# Patient Record
Sex: Male | Born: 1975 | Hispanic: No | Marital: Single | Smoking: Never smoker
Health system: Southern US, Community
[De-identification: ages and names within clinical notes are randomized; demographics above are authoritative.]

---

## 2002-04-24 ENCOUNTER — Encounter: Payer: Self-pay | Admitting: General Practice

## 2002-04-24 ENCOUNTER — Encounter: Admission: RE | Admit: 2002-04-24 | Discharge: 2002-04-24 | Payer: Self-pay | Admitting: General Practice

## 2002-05-24 ENCOUNTER — Emergency Department (HOSPITAL_COMMUNITY): Admission: EM | Admit: 2002-05-24 | Discharge: 2002-05-24 | Payer: Self-pay | Admitting: Emergency Medicine

## 2004-06-14 HISTORY — PX: BACK SURGERY: SHX140

## 2006-04-26 ENCOUNTER — Ambulatory Visit: Payer: Self-pay | Admitting: Gastroenterology

## 2006-04-26 LAB — CONVERTED CEMR LAB
Iron: 62 ug/dL (ref 42–165)
Sed Rate: 10 mm/hr (ref 0–20)
Vitamin B-12: 697 pg/mL (ref 211–911)

## 2006-04-29 ENCOUNTER — Ambulatory Visit: Payer: Self-pay | Admitting: Gastroenterology

## 2006-05-02 ENCOUNTER — Encounter (INDEPENDENT_AMBULATORY_CARE_PROVIDER_SITE_OTHER): Payer: Self-pay | Admitting: *Deleted

## 2006-05-02 ENCOUNTER — Ambulatory Visit: Payer: Self-pay | Admitting: Gastroenterology

## 2006-05-09 ENCOUNTER — Ambulatory Visit (HOSPITAL_COMMUNITY): Admission: RE | Admit: 2006-05-09 | Discharge: 2006-05-09 | Payer: Self-pay | Admitting: Gastroenterology

## 2006-10-06 ENCOUNTER — Encounter: Admission: RE | Admit: 2006-10-06 | Discharge: 2006-10-06 | Payer: Self-pay | Admitting: Specialist

## 2006-12-01 ENCOUNTER — Inpatient Hospital Stay (HOSPITAL_COMMUNITY): Admission: RE | Admit: 2006-12-01 | Discharge: 2006-12-02 | Payer: Self-pay | Admitting: Specialist

## 2008-10-30 ENCOUNTER — Emergency Department (HOSPITAL_COMMUNITY): Admission: EM | Admit: 2008-10-30 | Discharge: 2008-10-30 | Payer: Self-pay | Admitting: Emergency Medicine

## 2008-11-18 ENCOUNTER — Emergency Department (HOSPITAL_COMMUNITY): Admission: EM | Admit: 2008-11-18 | Discharge: 2008-11-19 | Payer: Self-pay | Admitting: Emergency Medicine

## 2009-05-26 ENCOUNTER — Emergency Department (HOSPITAL_COMMUNITY): Admission: EM | Admit: 2009-05-26 | Discharge: 2009-05-26 | Payer: Self-pay | Admitting: Emergency Medicine

## 2009-10-16 ENCOUNTER — Emergency Department (HOSPITAL_COMMUNITY): Admission: EM | Admit: 2009-10-16 | Discharge: 2009-10-16 | Payer: Self-pay | Admitting: Emergency Medicine

## 2009-11-03 ENCOUNTER — Emergency Department (HOSPITAL_COMMUNITY): Admission: EM | Admit: 2009-11-03 | Discharge: 2009-11-03 | Payer: Self-pay | Admitting: Emergency Medicine

## 2009-12-12 ENCOUNTER — Emergency Department (HOSPITAL_COMMUNITY): Admission: EM | Admit: 2009-12-12 | Discharge: 2009-12-13 | Payer: Self-pay | Admitting: Emergency Medicine

## 2010-02-13 ENCOUNTER — Encounter
Admission: RE | Admit: 2010-02-13 | Discharge: 2010-03-17 | Payer: Self-pay | Admitting: Physical Medicine & Rehabilitation

## 2010-02-20 ENCOUNTER — Ambulatory Visit: Payer: Self-pay | Admitting: Physical Medicine & Rehabilitation

## 2010-03-02 ENCOUNTER — Ambulatory Visit (HOSPITAL_COMMUNITY)
Admission: RE | Admit: 2010-03-02 | Discharge: 2010-03-02 | Payer: Self-pay | Admitting: Physical Medicine & Rehabilitation

## 2010-03-12 ENCOUNTER — Encounter
Admission: RE | Admit: 2010-03-12 | Discharge: 2010-03-19 | Payer: Self-pay | Source: Home / Self Care | Attending: Physical Medicine & Rehabilitation | Admitting: Physical Medicine & Rehabilitation

## 2010-03-17 ENCOUNTER — Encounter
Admission: RE | Admit: 2010-03-17 | Discharge: 2010-03-24 | Payer: Self-pay | Source: Home / Self Care | Attending: Physical Medicine & Rehabilitation | Admitting: Physical Medicine & Rehabilitation

## 2010-03-19 ENCOUNTER — Ambulatory Visit: Payer: Self-pay | Admitting: Physical Medicine & Rehabilitation

## 2010-03-24 ENCOUNTER — Ambulatory Visit: Payer: Self-pay | Admitting: Physical Medicine & Rehabilitation

## 2010-05-26 ENCOUNTER — Encounter
Admission: RE | Admit: 2010-05-26 | Discharge: 2010-07-14 | Payer: Self-pay | Source: Home / Self Care | Attending: Physical Medicine & Rehabilitation | Admitting: Physical Medicine & Rehabilitation

## 2010-05-26 ENCOUNTER — Ambulatory Visit: Payer: Self-pay | Admitting: Physical Medicine & Rehabilitation

## 2010-07-01 ENCOUNTER — Encounter
Admission: RE | Admit: 2010-07-01 | Discharge: 2010-07-14 | Payer: Self-pay | Source: Home / Self Care | Attending: Physical Medicine & Rehabilitation | Admitting: Physical Medicine & Rehabilitation

## 2010-07-05 ENCOUNTER — Encounter: Payer: Self-pay | Admitting: Gastroenterology

## 2010-07-13 ENCOUNTER — Ambulatory Visit
Admission: RE | Admit: 2010-07-13 | Discharge: 2010-07-13 | Payer: Self-pay | Source: Home / Self Care | Attending: Physical Medicine & Rehabilitation | Admitting: Physical Medicine & Rehabilitation

## 2010-08-11 ENCOUNTER — Ambulatory Visit: Payer: Self-pay | Admitting: Physical Medicine & Rehabilitation

## 2010-08-11 ENCOUNTER — Encounter: Payer: Medicaid Other | Attending: Physical Medicine & Rehabilitation

## 2010-08-30 LAB — DIFFERENTIAL
Basophils Absolute: 0 10*3/uL (ref 0.0–0.1)
Basophils Relative: 0 % (ref 0–1)
Eosinophils Absolute: 0.1 10*3/uL (ref 0.0–0.7)
Eosinophils Relative: 1 % (ref 0–5)
Monocytes Relative: 6 % (ref 3–12)
Neutrophils Relative %: 80 % — ABNORMAL HIGH (ref 43–77)

## 2010-08-30 LAB — CBC
HCT: 42.6 % (ref 39.0–52.0)
Hemoglobin: 14.6 g/dL (ref 13.0–17.0)
MCHC: 34.1 g/dL (ref 30.0–36.0)

## 2010-08-30 LAB — URINALYSIS, ROUTINE W REFLEX MICROSCOPIC
Bilirubin Urine: NEGATIVE
Ketones, ur: 40 mg/dL — AB
Specific Gravity, Urine: 1.024 (ref 1.005–1.030)
pH: 6.5 (ref 5.0–8.0)

## 2010-08-30 LAB — COMPREHENSIVE METABOLIC PANEL
ALT: 24 U/L (ref 0–53)
AST: 25 U/L (ref 0–37)
Albumin: 3.5 g/dL (ref 3.5–5.2)
Calcium: 8.5 mg/dL (ref 8.4–10.5)
Chloride: 103 mEq/L (ref 96–112)
Creatinine, Ser: 1.01 mg/dL (ref 0.4–1.5)
GFR calc non Af Amer: >60 mL/min (ref >60)
Potassium: 3.3 mEq/L — ABNORMAL LOW (ref 3.5–5.1)
Total Protein: 7.6 g/dL (ref 6.0–8.3)

## 2010-09-01 LAB — CBC
HCT: 40.5 % (ref 39.0–52.0)
MCHC: 34.7 g/dL (ref 30.0–36.0)
MCV: 90.5 fL (ref 78.0–100.0)
Platelets: 295 10*3/uL (ref 150–400)
RBC: 4.47 MIL/uL (ref 4.22–5.81)
RDW: 12.4 % (ref 11.5–15.5)
WBC: 5.4 10*3/uL (ref 4.0–10.5)

## 2010-09-01 LAB — DIFFERENTIAL
Basophils Absolute: 0 10*3/uL (ref 0.0–0.1)
Basophils Relative: 0 % (ref 0–1)
Eosinophils Absolute: 0.5 10*3/uL (ref 0.0–0.7)
Eosinophils Relative: 10 % — ABNORMAL HIGH (ref 0–5)
Lymphocytes Relative: 42 % (ref 12–46)
Neutrophils Relative %: 41 % — ABNORMAL LOW (ref 43–77)

## 2010-09-01 LAB — URINALYSIS, ROUTINE W REFLEX MICROSCOPIC
Bilirubin Urine: NEGATIVE
Protein, ur: NEGATIVE mg/dL
Urobilinogen, UA: 1 mg/dL (ref 0.0–1.0)

## 2010-09-01 LAB — COMPREHENSIVE METABOLIC PANEL
ALT: 26 U/L (ref 0–53)
AST: 24 U/L (ref 0–37)
Albumin: 3.6 g/dL (ref 3.5–5.2)
Alkaline Phosphatase: 63 U/L (ref 39–117)
Calcium: 9 mg/dL (ref 8.4–10.5)
Chloride: 105 mEq/L (ref 96–112)
Creatinine, Ser: 0.9 mg/dL (ref 0.4–1.5)
GFR calc Af Amer: >60 mL/min (ref >60)

## 2010-09-01 LAB — URINE MICROSCOPIC-ADD ON

## 2010-09-01 LAB — LIPASE, BLOOD: Lipase: 37 U/L (ref 11–59)

## 2010-09-21 LAB — POCT I-STAT, CHEM 8
BUN: 11 mg/dL (ref 6–23)
Calcium, Ion: 1.16 mmol/L (ref 1.12–1.32)
Chloride: 103 mEq/L (ref 96–112)
Creatinine, Ser: 1.1 mg/dL (ref 0.4–1.5)
Glucose, Bld: 92 mg/dL (ref 70–99)
Potassium: 3.9 mEq/L (ref 3.5–5.1)

## 2010-09-21 LAB — URINALYSIS, ROUTINE W REFLEX MICROSCOPIC
Glucose, UA: NEGATIVE mg/dL
Leukocytes, UA: NEGATIVE
Nitrite: NEGATIVE
Protein, ur: 30 mg/dL — AB
pH: 6 (ref 5.0–8.0)

## 2010-09-21 LAB — RAPID STREP SCREEN (MED CTR MEBANE ONLY): Streptococcus, Group A Screen (Direct): POSITIVE — AB

## 2010-09-21 LAB — CBC
HCT: 49 % (ref 39.0–52.0)
MCHC: 33.4 g/dL (ref 30.0–36.0)
Platelets: 199 10*3/uL (ref 150–400)
RDW: 12.8 % (ref 11.5–15.5)

## 2010-09-21 LAB — URINE MICROSCOPIC-ADD ON

## 2010-09-21 LAB — DIFFERENTIAL
Basophils Relative: 0 % (ref 0–1)
Lymphocytes Relative: 16 % (ref 12–46)
Lymphs Abs: 1.4 10*3/uL (ref 0.7–4.0)
Monocytes Absolute: 0.7 10*3/uL (ref 0.1–1.0)
Monocytes Relative: 8 % (ref 3–12)
Neutro Abs: 6.3 10*3/uL (ref 1.7–7.7)
Neutrophils Relative %: 73 % (ref 43–77)

## 2010-09-21 LAB — POCT CARDIAC MARKERS
CKMB, poc: <1 ng/mL — ABNORMAL LOW (ref 1.0–8.0)
Troponin i, poc: <0.05 ng/mL (ref 0.00–0.09)

## 2010-09-21 LAB — COMPREHENSIVE METABOLIC PANEL
Albumin: 3.7 g/dL (ref 3.5–5.2)
Alkaline Phosphatase: 102 U/L (ref 39–117)
BUN: 8 mg/dL (ref 6–23)
Calcium: 9.8 mg/dL (ref 8.4–10.5)
Potassium: 3.7 mEq/L (ref 3.5–5.1)
Total Protein: 9.2 g/dL — ABNORMAL HIGH (ref 6.0–8.3)

## 2010-09-22 LAB — URINALYSIS, ROUTINE W REFLEX MICROSCOPIC
Hgb urine dipstick: NEGATIVE
Nitrite: NEGATIVE
Protein, ur: NEGATIVE mg/dL
Specific Gravity, Urine: 1.02 (ref 1.005–1.030)
Urobilinogen, UA: 0.2 mg/dL (ref 0.0–1.0)

## 2010-09-22 LAB — DIFFERENTIAL
Basophils Absolute: 0 10*3/uL (ref 0.0–0.1)
Basophils Relative: 1 % (ref 0–1)
Lymphocytes Relative: 23 % (ref 12–46)
Neutro Abs: 3.6 10*3/uL (ref 1.7–7.7)
Neutrophils Relative %: 61 % (ref 43–77)

## 2010-09-22 LAB — POCT I-STAT, CHEM 8
Creatinine, Ser: 1.1 mg/dL (ref 0.4–1.5)
Glucose, Bld: 99 mg/dL (ref 70–99)
HCT: 47 % (ref 39.0–52.0)
Hemoglobin: 16 g/dL (ref 13.0–17.0)
Potassium: 3.9 mEq/L (ref 3.5–5.1)
Sodium: 136 mEq/L (ref 135–145)
TCO2: 27 mmol/L (ref 0–100)

## 2010-09-22 LAB — CBC
Hemoglobin: 14.3 g/dL (ref 13.0–17.0)
MCHC: 32.2 g/dL (ref 30.0–36.0)
RBC: 4.94 MIL/uL (ref 4.22–5.81)
RDW: 13.1 % (ref 11.5–15.5)

## 2010-10-12 ENCOUNTER — Encounter: Payer: Medicaid Other | Attending: Physical Medicine & Rehabilitation | Admitting: Physical Medicine & Rehabilitation

## 2010-10-12 DIAGNOSIS — M5137 Other intervertebral disc degeneration, lumbosacral region: Secondary | ICD-10-CM

## 2010-10-12 DIAGNOSIS — M545 Low back pain, unspecified: Secondary | ICD-10-CM | POA: Insufficient documentation

## 2010-10-12 DIAGNOSIS — IMO0002 Reserved for concepts with insufficient information to code with codable children: Secondary | ICD-10-CM | POA: Insufficient documentation

## 2010-10-12 DIAGNOSIS — M79609 Pain in unspecified limb: Secondary | ICD-10-CM | POA: Insufficient documentation

## 2010-10-12 DIAGNOSIS — M961 Postlaminectomy syndrome, not elsewhere classified: Secondary | ICD-10-CM | POA: Insufficient documentation

## 2010-10-12 NOTE — Assessment & Plan Note (Signed)
Roy Miller is back regarding his back and leg pain.  He missed his last appointment as he stated he forgot and he was not called with the reminder.  He had about 4-5 days of significant relief with the last epidural steroid injection, but pain has come back again.  He rates his pain 8-9/10.  Pain is sharp.  It bothers when he begins to do any work around the house or walks for prolonged period of time.  Sleep is poor. He is off all his medications essentially that we had started at the wintertime.  REVIEW OF SYSTEMS:  Notable for the above.  Full 12-point review is in the written health and history section of the chart.  SOCIAL HISTORY:  The patient is single living with his family.  PHYSICAL EXAMINATION:  VITAL SIGNS:  Blood pressure is 105/68, pulse 73, respiratory rate 18, satting 97% on room air. GENERAL:  The patient is pleasant, alert, and oriented x3.  He is antalgic on the right side, but seems in general more comfortable especially in sitting and standing that he was last visit. MUSCULOSKELETAL:  Reflexes are 1+ in lower extremity.  He may have some diminishment of his right side lower extremity sensory exam.  Strength is inhibited due to pain proximally in the right hip.  There is no focal motor weakness distally.  He had pain with palpation over the L4-L5 region particularly on the right.  He is limited with flexion to about 30 degrees with side bending to about 15 degrees, rotation to about 20 degrees on the right.  Extension caused some mild discomfort.  Straight leg testing was positive on the right. HEART:  Regular. CHEST:  Clear. ABDOMEN:  Soft, nontender. NEUROLOGIC:  The patient is alert and appropriate.  ASSESSMENT: 1. 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  The patient had 4-5     days substantial relief greater than 50% after the last     transforaminal injection.  PLAN: 1. I would like  to repeat his transforaminal injection.  The     translaminar approach was attempted, but could not be completed due     to his scar tissue. 2. We started Topamax titrating to 150 mg over the next 2 weeks.  He     is asked to call with any problems or concerns. 3. Initiate meloxicam 15 mg p.o. q.a.m. with food. 4. If he does well with his injection, I would like to initiate     physical therapy. 5. We will hold off any narcotic treatment at this point. 6. As a whole, I do believe he is looking a bit better than he did in     December.     Ranelle Oyster, M.D. Electronically Signed    ZTS/MedQ D:  10/12/2010 10:53:08  T:  10/12/2010 23:13:45  Job #:  [id number]

## 2010-10-27 NOTE — Op Note (Signed)
Roy Miller, Roy Miller                    ACCOUNT NO.:  [id number]   MEDICAL RECORD NO.:  [id number]          PATIENT TYPE:  AMB   LOCATION:  DAY                          FACILITY:  [facility]   PHYSICIAN:  Jene Every, M.D.    DATE OF BIRTH:  Jun 04, 1976   DATE OF PROCEDURE:  11/30/2006  DATE OF DISCHARGE:                               OPERATIVE REPORT   PREOPERATIVE DIAGNOSIS:  Spinal stenosis, lateral recess stenosis, L4-5  right.   POSTOPERATIVE DIAGNOSIS:  Spinal stenosis, lateral recess stenosis, L4-5  right.   PROCEDURE PERFORMED:  Lateral recess decompression, foraminotomy of L4-  5.   ANESTHESIA:  General.   ASSISTANT:  Roma Schanz, P.A.   BRIEF HISTORY AND INDICATIONS:  This is a 35 year old with right lower  extremity radicular pain, L5 nerve root distribution.  The patient had  persistent right lower extremity radicular pain refractory to  conservative treatment.  His MRI indicated lateral recess stenosis at L4-  5.  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  I did feel he had symptoms related to  stenosis, multifactorial, at L4-5, including the disk protrusion.  He  was indicated for decompression of the L5 root, risks and benefits  discussed, including bleeding, infection, damage to vascular structures,  CSF leakage, epidural fibrosis, need for the fusion in the future,  anesthetic complications, etc.   TECHNIQUE:  Patient placed in supine position.  After the induction of  adequate anesthesia and 1 g of Kefzol, he was placed prone on the  Bridger frame, all bony prominences well-padded.  The lumbar region was  prepped and draped in the usual sterile fashion.  Two 18-gauge spinal  needles were utilized to localize the L4-5 for five interspace,  confirmed with x-ray.  An incision was  made from the spinous process of  L4 to L5.  The subcutaneous tissue was dissected and electrocautery was  utilized to achieve hemostasis.  The dorsal lumbar fascia identified and  divided in the line of the skin incision.  Paraspinous muscle elevated  from the lamina of 4 and L5 taking care not to enter the pars defect on  the right at L5.  A McCullough retractor was placed.  Operating  microscope draped, brought in the surgical field after confirmatory  radiograph obtained confirming the L4-5 space.  The ligamentum flavum  was detached from the caudad edge of L4 with a curette.  A 2 mm Kerrison  was utilized to perform a hemilaminotomy at the cephalad edge of the  ligamentum flavum.  Significant lateral recess stenosis but secondary  ligamentum and facet hypertrophy.  This was decompressed with a 2 mm  Kerrison.  We preserved the pars, the majority detached from the  ligamentum flavum on the cephalad edge of L5. The ligamentum flavum,  however, was attached laterally, was noted to be compressing the L5 root  into the lateral recess.  Following decompression of the ligamentum,  we  gently mobilized the root medially.  There was an epidural venous plexus  there, which was lysed.  This was felt to be contributing to the  stenosis.  There was a small disk bulge there but no frank herniation.  It was felt that it would be best to preserve the annular competency and  the disk given that there was adequate decompression at this point.  We  freely passed a hockey stick probe in the foramen of L5 and L4.  There  was no evidence of neural compression at L4 or even at L5.  We copiously  irrigated the wound.  Inspection revealed no evidence of CSF leakage or  active bleeding.  We removed the McCullough retractor from the  paraspinous muscles and irrigated.  Repaired with 0 Vicryl interrupted  figure-of-eight sutures.  Subcutaneous tissue approximated with 2-0  Vicryl suture, skin with 4-0  Prolene  subcuticular, wound reinforced with Steri-Strips, sterile  dressing applied.  Placed supine on hospital bed, extubated without  difficulty and transported to the recovery room in satisfactory  condition.   The patient tolerated the procedure well with no complications.      Jene Every, M.D.  Electronically Signed     JB/MEDQ  D:  11/30/2006  T:  11/30/2006  Job:  [id number]

## 2010-10-30 NOTE — Assessment & Plan Note (Signed)
[facility]                           GASTROENTEROLOGY OFFICE NOTE   NAME:Roy Miller, Roy Miller                           MRN:          [id number]  DATE:04/26/2006                            DOB:          [birth year]    INDICATION:  Roy Miller is a 35 year old African male from [place] referred  through the courtesy of [facility] for evaluation of epigastric pain,  nausea, and weight loss.  Roy Miller has had 2 years of rather constant  epigastric discomfort made worse by eating, associated with a 20 pound  weight loss.  He does have some reflux symptoms with burning pain up into  his chest, although a lot of his chest pain has definite musculoskeletal  complaints without cardiovascular relationships.  He denies any  hepatobiliary complaints or history of previous known GI problems.  He has  some mild nausea with no emesis and having regular bowel movements without  melena, hematochezia, or diarrhea.  He was treated in August 2007 with a  prev pack because of a positive H. pylori antibody. He has had no  improvement.  He has used some Nexium with mild improvement but is not  taking Nexium at this time.  Laboratory data showed a normal metabolic  profile, amylase, lipase, thyroid function test, and CBC.  I cannot see  where he had any barium studies or ultrasound exam.   He follows a regular diet, is afraid to eat at this point.  He has had no  hematemesis, melena, or fever, chills, skin rashes, joint pains, oral  stomatitis, or other systemic complaints.   PAST MEDICAL HISTORY:  Is otherwise noncontributory.   MEDICATIONS:  None.   ALLERGIES:  None.   FAMILY HISTORY:  Noncontributory.   SOCIAL HISTORY:  The patient is single and lives with a girlfriend.  He has  a high school education.  He denies alcohol or cigarette use.   REVIEW OF SYSTEMS:  Noncontributory. He denies any infectious diseases or  previous illnesses when he lived in [place].   He is a  healthy appearing black male in no acute distress.  He appears his  stated age.  He is 5 feet 11 inches tall, weighs 150 pounds.  Blood pressure 138/60.  Pulse was 80 and regular.  I could not appreciate_scleral icterus . There is no thyromegaly or  lymphadenopathy noted.  CHEST:  Was clear to percussion and auscultation anteriorly and posteriorly.  He appeared to be in a regular rhythm without significant murmurs, gallops  or rubs.  I could not appreciate hepatosplenomegaly, abdominal masses or tenderness.  Bowel sounds were normal.  EXTREMITIES:  Were unremarkable.  Mental status was clear.  Inspection of the rectum was unremarkable as was his rectal exam.  There are  no rectal masses or tenderness, stool is guaiac negative.   ASSESSMENT:  Roy Miller has rather severe persistent epigastric pain with  rather marked anorexia and weight loss of unexplained etiology.  His  symptoms certainly do not seem consistent with peptic ulcer disease, but he  may have some occult of underlying gastrointestinal  pathology.  Actually  today on the stool guaiac,  he did what appeared to be a possibly trace  guaiac positive stool.   RECOMMENDATIONS:  1. Endoscopy and upper abdominal ultrasound.  2. AcipHex 20 mg every morning with Carafate slurry 1 gram after meals and      at bedtime.  3. Complete lab data including sed rate and serum trypsinogen.  4. Consider small bowel series versus internal capsule testing.     Vania Rea. Jarold Motto, MD, Caleen Essex, FAGA  Electronically Signed    DRP/MedQ  DD: 04/26/2006  DT: 04/26/2006  Job #: [id number]   cc:   Dorie Rank, P.A.

## 2010-11-16 ENCOUNTER — Encounter: Payer: Medicaid Other | Attending: Physical Medicine & Rehabilitation

## 2010-11-16 ENCOUNTER — Ambulatory Visit (HOSPITAL_BASED_OUTPATIENT_CLINIC_OR_DEPARTMENT_OTHER): Payer: Medicaid Other | Admitting: Physical Medicine & Rehabilitation

## 2010-11-16 DIAGNOSIS — IMO0002 Reserved for concepts with insufficient information to code with codable children: Secondary | ICD-10-CM | POA: Insufficient documentation

## 2010-11-16 DIAGNOSIS — M79609 Pain in unspecified limb: Secondary | ICD-10-CM | POA: Insufficient documentation

## 2010-11-16 DIAGNOSIS — M961 Postlaminectomy syndrome, not elsewhere classified: Secondary | ICD-10-CM | POA: Insufficient documentation

## 2010-11-16 DIAGNOSIS — M545 Low back pain, unspecified: Secondary | ICD-10-CM | POA: Insufficient documentation

## 2010-11-16 NOTE — Procedures (Signed)
[name], Roy Miller                    ACCOUNT NO.:  [id number]  MEDICAL RECORD NO.:  [id number]           PATIENT TYPE:  O  LOCATION:  [facility]PC                          FACILITY:  [facility]  PHYSICIAN:  Erick Colace, M.D.DATE OF BIRTH:  September 17, 1975  DATE OF PROCEDURE: DATE OF DISCHARGE:                              OPERATIVE REPORT  REASON FOR VISIT:  Right lumbosacral radiculopathy.  L4-5 transforaminal injection requested.  The patient had good results with last injection performed on July 13, 2010.  Informed consent was obtained after describing risks and benefits of the procedure with the patient.  These include bleeding, bruising, and infection, he elects to proceed and has given written consent.  The patient placed prone on fluoroscopy table.  Betadine prep, sterile drape, a 25-gauge inch and half needle was used to anesthetize the skin and subcutaneous tissue with 1% lidocaine x2 mL.  Then a 22-gauge 3-1/2- inch spinal needle was inserted under fluoroscopic guidance targeting the right L4-5 intervertebral foramen.  AP and lateral images utilized. Omnipaque 180 under live fluoro demonstrated no intravascular uptake. AP lateral and oblique images utilized.  Omnipaque 180 x2 mL demonstrated good epidural nerve root spread followed by injection of 1 mL of 10 mg/mL dexamethasone and 2 mL of 1% MPF lidocaine.  The patient tolerated procedure well.  Postprocedure instructions given.  Follow up with Dr. Riley Kill in 1 month.     Erick Colace, M.D. Electronically Signed    AEK/MEDQ  D:  11/16/2010 09:50:11  T:  11/16/2010 22:00:31  Job:  [id number]

## 2010-12-07 ENCOUNTER — Encounter: Payer: Medicaid Other | Attending: Physical Medicine & Rehabilitation | Admitting: Physical Medicine & Rehabilitation

## 2010-12-07 DIAGNOSIS — M79609 Pain in unspecified limb: Secondary | ICD-10-CM | POA: Insufficient documentation

## 2010-12-07 DIAGNOSIS — M961 Postlaminectomy syndrome, not elsewhere classified: Secondary | ICD-10-CM

## 2010-12-07 DIAGNOSIS — IMO0002 Reserved for concepts with insufficient information to code with codable children: Secondary | ICD-10-CM

## 2010-12-07 DIAGNOSIS — M545 Low back pain, unspecified: Secondary | ICD-10-CM | POA: Insufficient documentation

## 2010-12-07 DIAGNOSIS — M5137 Other intervertebral disc degeneration, lumbosacral region: Secondary | ICD-10-CM

## 2010-12-08 NOTE — Assessment & Plan Note (Signed)
Roy Miller is back regarding his back and leg pain.  He had a 1-day relief with the transforaminal injection.  He is having continued pain and perceived weakness in the leg.  He has had back pain as well.  Sleep is poor.  He is off narcotics.  We started him on meloxicam last visit.  He is only taking 100 mg of the Topamax currently.  Pain is 9/10, described as dull, stabbing, tingling, and aching.  REVIEW OF SYSTEMS:  Notable for the above.  Denies any bowel or bladder issues.  Full 12-point review is in the written health and history section of the chart.  SOCIAL HISTORY:  The patient is single, living with family.  PHYSICAL EXAMINATION:  VITAL SIGNS:  Blood pressure is 99/61, pulse 91, respiratory rate 18, and he is saturating 98% on room air. GENERAL:  The patient is pleasant and alert. NEUROLOGIC:  He has positive straight leg raise on the right side.  He has pain down the right leg with simple flexion and standing.  He has pain over the L5-S1 region on the right into the right PSIS area. Strength seems to be generally preserved as there is a lot of pain inhibition in the right leg.  Reflexes are 1+ to 2+ throughout the right leg.  Sensory exam is grossly intact throughout both legs.  ASSESSMENT:  Chronic low back pain with post laminectomy syndrome and residual radiculopathy involving the L5-S1 roots.  PLAN: 1. As he has only had 1-day relief of last injection, we will once     again attempt a medicinal approach.  We will increase his Topamax     to 200 mg at bedtime.  I will reintroduce the morphine controlled     release 15 mg q.12 h.  We did not start hydrocodone as he had     dizziness with this.  We will continue the meloxicam.  We will     consider tricyclic antidepressant. 2. We will send for physical therapy to work on desensitization and     home exercise program.  Unfortunately, it will be limited to only 3     visits. 3. Consider follow up MRI scan if symptoms do not  improve.     Ranelle Oyster, M.D. Electronically Signed    ZTS/MedQ D:  12/07/2010 10:46:18  T:  12/08/2010 00:06:19  Job #:  [id number]

## 2011-01-11 ENCOUNTER — Encounter: Payer: Medicaid Other | Admitting: Physical Medicine & Rehabilitation

## 2011-02-12 ENCOUNTER — Encounter: Payer: Medicaid Other | Attending: Physical Medicine & Rehabilitation | Admitting: Physical Medicine & Rehabilitation

## 2011-03-31 LAB — ABO/RH: ABO/RH(D): A POS

## 2011-03-31 LAB — PROTIME-INR
INR: 1
Prothrombin Time: 12.8

## 2011-03-31 LAB — TYPE AND SCREEN

## 2011-03-31 LAB — HEMOGLOBIN AND HEMATOCRIT, BLOOD: HCT: 45.8

## 2011-05-24 IMAGING — CT CT ABD-PELV W/ CM
1 of 2 series · 16 of 32 positions shown, 20 images · IV contrast (APPLIED)
Comparison: None.

CLINICAL DATA: Total body pain.  Right lower quadrant pain
radiating to right flank.

CT ABDOMEN AND PELVIS WITH CONTRAST
TECHNIQUE: Multidetector CT imaging of the abdomen and pelvis was
performed using the standard protocol during bolus administration
of intravenous contrast.
Contrast:  Dmnipaque-111, 100 ml

[Series 2: abd/pelv with 5.0 b31f st · axial · 0.66mm/px · z∈[+366,+822]mm · 16 of 99 slices shown, 20 images]
[im 4/99  soft-tissue]
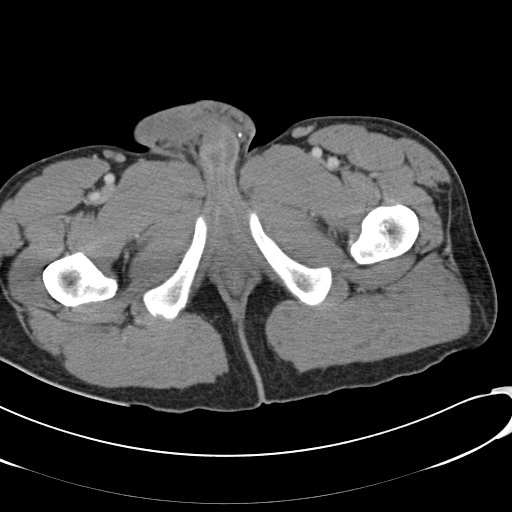
[im 4/99  bone]
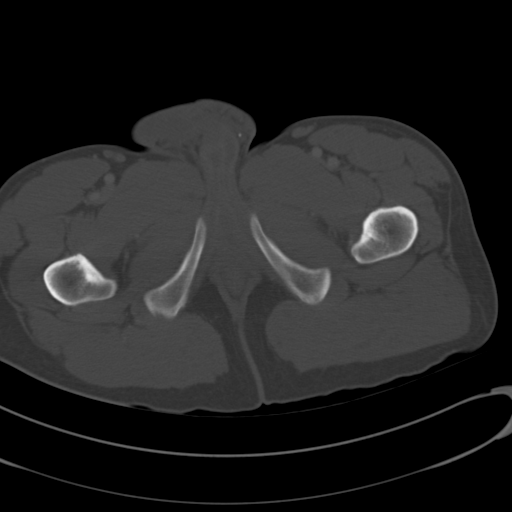
[im 11/99  soft-tissue]
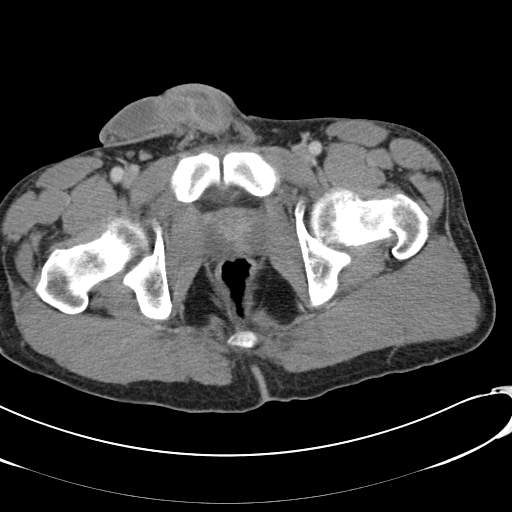
[im 19/99  soft-tissue]
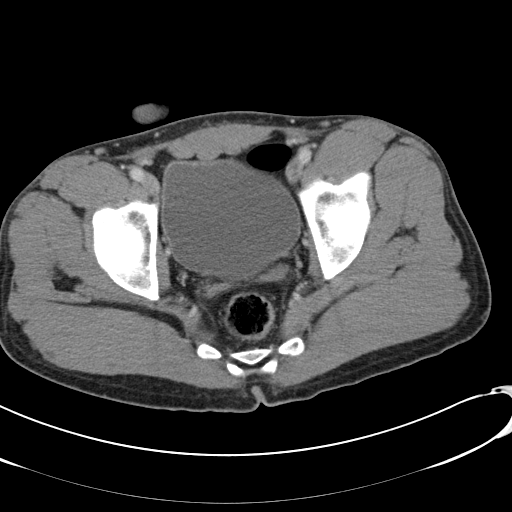
[im 26/99  soft-tissue]
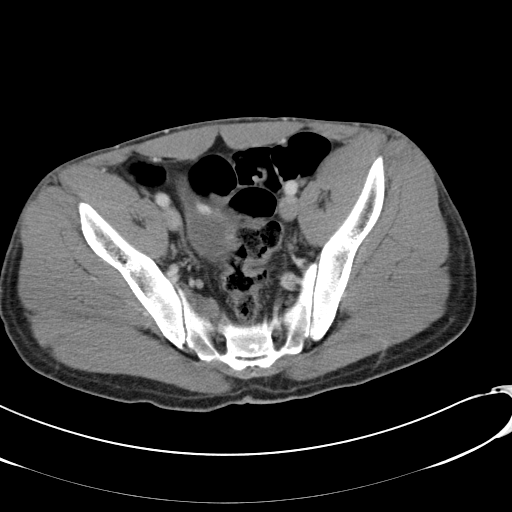
[im 33/99  soft-tissue]
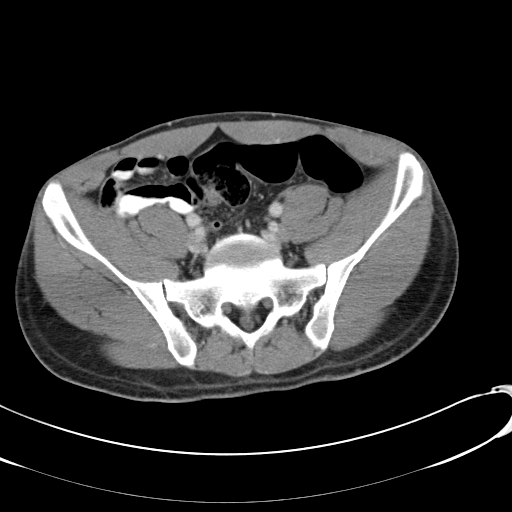
[im 40/99  soft-tissue]
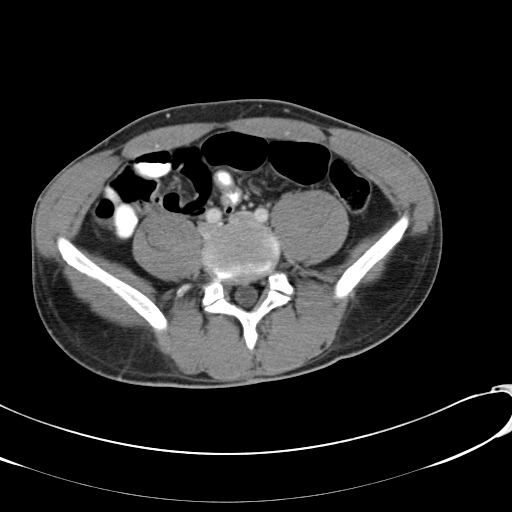
[im 48/99  soft-tissue]
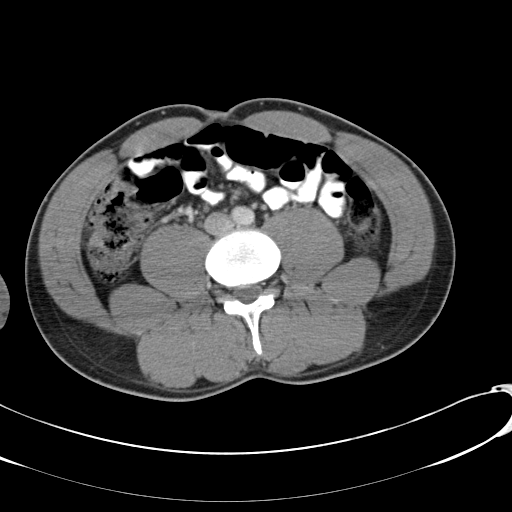
[im 51/99  soft-tissue]
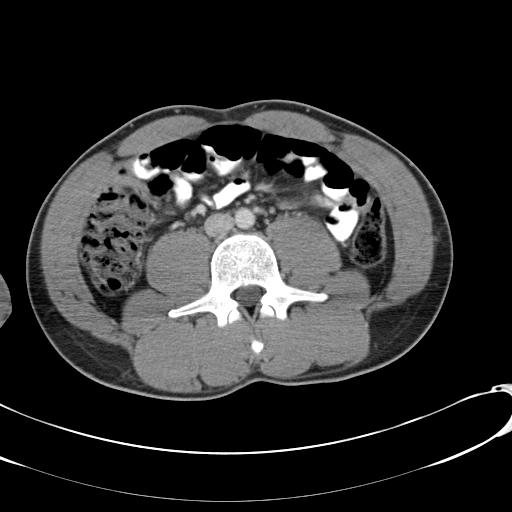
[im 59/99  soft-tissue]
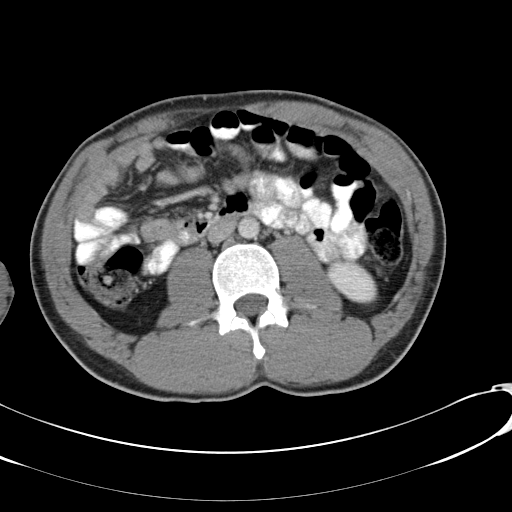
[im 59/99  bone]
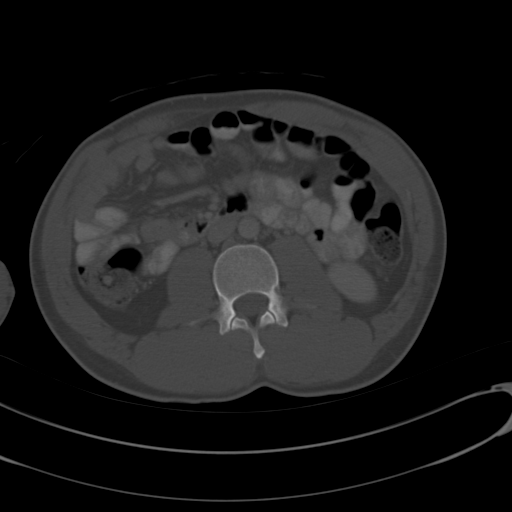
[im 66/99  soft-tissue]
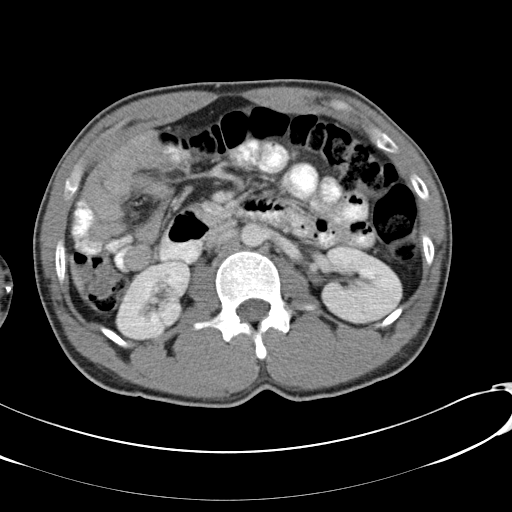
[im 73/99  soft-tissue]
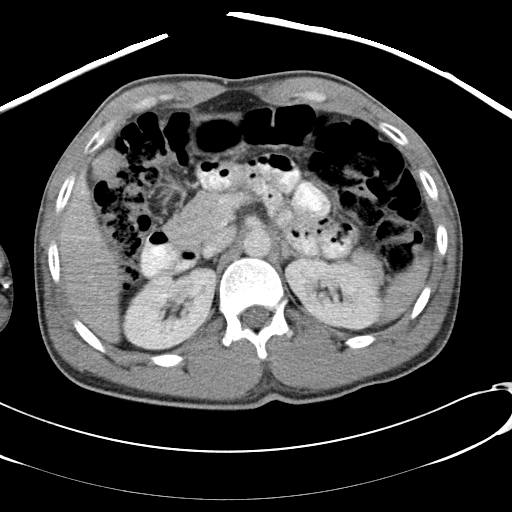
[im 80/99  soft-tissue]
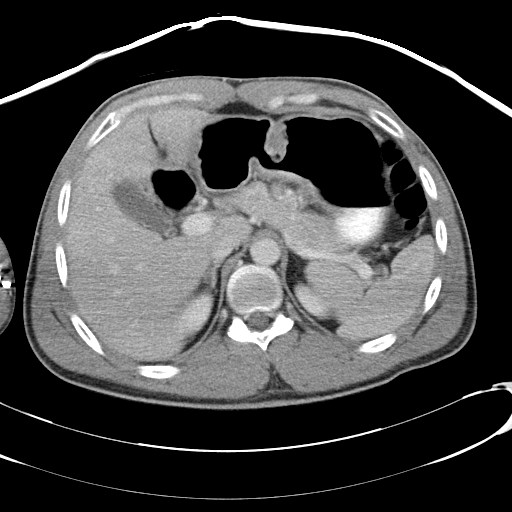
[im 84/99  lung]
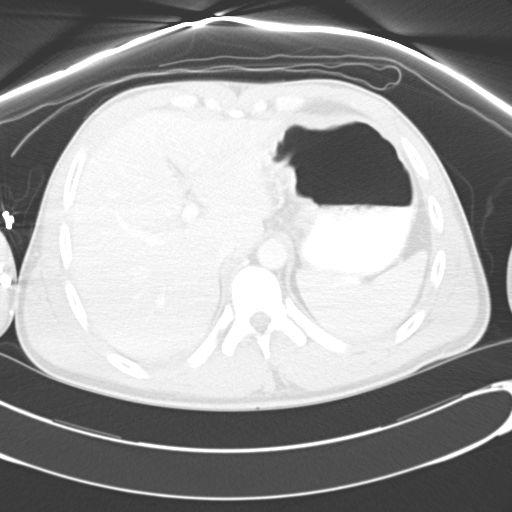
[im 88/99  soft-tissue]
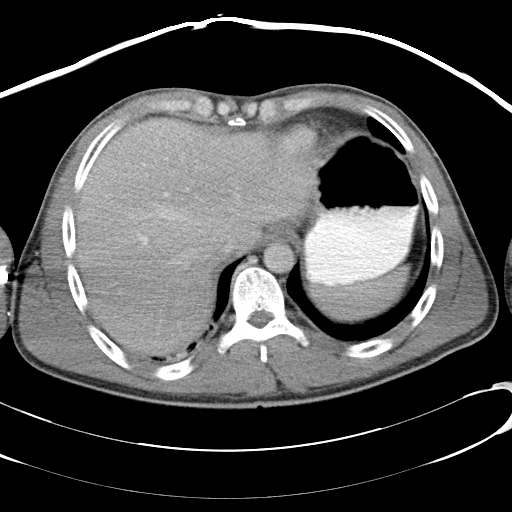
[im 88/99  lung]
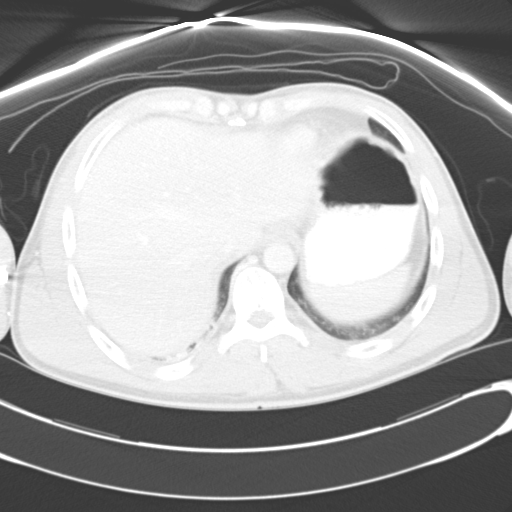
[im 91/99  lung]
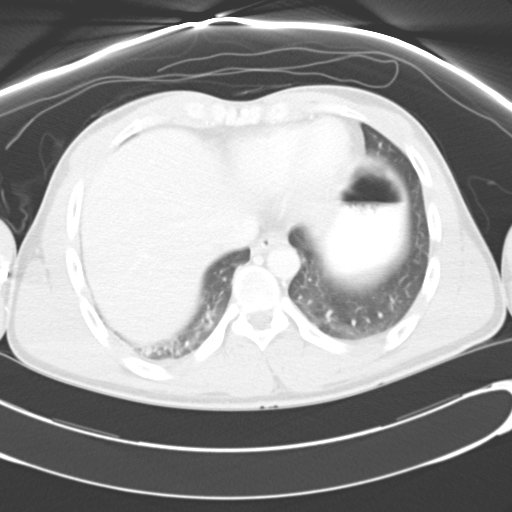
[im 95/99  soft-tissue]
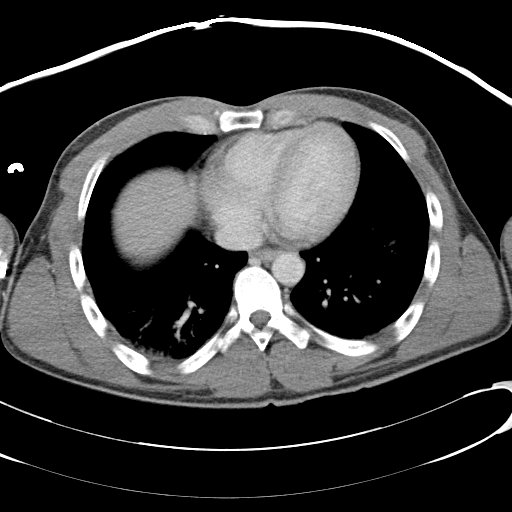
[im 95/99  lung]
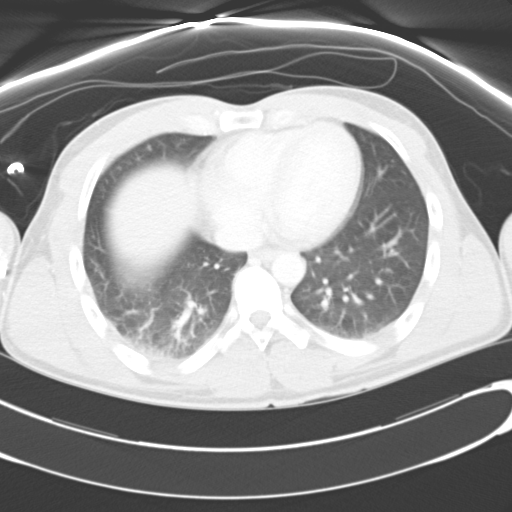

[16 of 32 positions shown; findings below may reference images not displayed]

FINDINGS: Liver, spleen, pancreas, adrenal glands, and kidneys
normal.  Gallbladder unremarkable by CT.  No biliary ductal
dilation.  Stomach and visualized large and small bowel
unremarkable.  Abdominal aorta normal in caliber.  No significant
lymphadenopathy.  No free fluid.  Visualized lung bases clear.

Appendix identified and normal.  Visualized colon and small bowel
unremarkable.  No free fluid.  Prostate gland and seminal vesicles
normal for age.  No significant lymphadenopathy. Urinary bladder
normal.
IMPRESSION: Normal CT of the abdomen and pelvis.

## 2012-11-14 ENCOUNTER — Encounter (HOSPITAL_COMMUNITY): Payer: Self-pay | Admitting: Emergency Medicine

## 2012-11-14 ENCOUNTER — Emergency Department (HOSPITAL_COMMUNITY)
Admission: EM | Admit: 2012-11-14 | Discharge: 2012-11-14 | Disposition: A | Discharge status: Home / Self Care | Payer: Self-pay | Attending: Emergency Medicine | Admitting: Emergency Medicine

## 2012-11-14 DIAGNOSIS — M5431 Sciatica, right side: Secondary | ICD-10-CM

## 2012-11-14 DIAGNOSIS — IMO0002 Reserved for concepts with insufficient information to code with codable children: Secondary | ICD-10-CM | POA: Insufficient documentation

## 2012-11-14 DIAGNOSIS — Y9389 Activity, other specified: Secondary | ICD-10-CM | POA: Insufficient documentation

## 2012-11-14 DIAGNOSIS — M1990 Unspecified osteoarthritis, unspecified site: Secondary | ICD-10-CM

## 2012-11-14 DIAGNOSIS — X500XXA Overexertion from strenuous movement or load, initial encounter: Secondary | ICD-10-CM | POA: Insufficient documentation

## 2012-11-14 DIAGNOSIS — Z79899 Other long term (current) drug therapy: Secondary | ICD-10-CM | POA: Insufficient documentation

## 2012-11-14 DIAGNOSIS — S335XXS Sprain of ligaments of lumbar spine, sequela: Secondary | ICD-10-CM

## 2012-11-14 DIAGNOSIS — R209 Unspecified disturbances of skin sensation: Secondary | ICD-10-CM | POA: Insufficient documentation

## 2012-11-14 DIAGNOSIS — M51379 Other intervertebral disc degeneration, lumbosacral region without mention of lumbar back pain or lower extremity pain: Secondary | ICD-10-CM | POA: Insufficient documentation

## 2012-11-14 DIAGNOSIS — M5430 Sciatica, unspecified side: Secondary | ICD-10-CM | POA: Insufficient documentation

## 2012-11-14 DIAGNOSIS — M5137 Other intervertebral disc degeneration, lumbosacral region: Secondary | ICD-10-CM | POA: Insufficient documentation

## 2012-11-14 DIAGNOSIS — Y929 Unspecified place or not applicable: Secondary | ICD-10-CM | POA: Insufficient documentation

## 2012-11-14 DIAGNOSIS — S335XXA Sprain of ligaments of lumbar spine, initial encounter: Secondary | ICD-10-CM | POA: Insufficient documentation

## 2012-11-14 MED ORDER — HYDROCODONE-ACETAMINOPHEN 5-325 MG PO TABS: 1.0000 | ORAL_TABLET | Freq: Three times a day (TID) | ORAL | Status: DC | PRN

## 2012-11-14 MED ORDER — CYCLOBENZAPRINE HCL 10 MG PO TABS: 10.0000 mg | ORAL_TABLET | Freq: Two times a day (BID) | ORAL | Status: DC | PRN

## 2012-11-14 MED ORDER — PREDNISONE 50 MG PO TABS: ORAL_TABLET | ORAL | Status: DC

## 2012-11-14 MED ORDER — HYDROCODONE-ACETAMINOPHEN 5-325 MG PO TABS
2.0000 | ORAL_TABLET | Freq: Once | ORAL | Status: AC
  Administered 2012-11-14: 2 via ORAL
  Filled 2012-11-14: qty 2

## 2012-11-14 NOTE — ED Provider Notes (Signed)
History     CSN: [id number]  Arrival date & time 11/14/12  1545   First MD Initiated Contact with Patient 11/14/12 1700      Chief Complaint  Patient presents with  . Back Pain    (Consider location/radiation/quality/duration/timing/severity/associated sxs/prior treatment) HPI Roy Miller is a 37 y/o with PMHx of lumbar DJD, lumbar sprain, nerve root injury to right in lumbar spine, and sciatica to the right, presenting to the ED with lower back pain that has been ongoing for the past 2 months, described as a sharp, shooting pain to the lower back with radiation to right leg with mild numbness. 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 Patient reported that he was following up with Work[employer] Compensation in [place], being seen by Dr. Marcy Salvo - MRI performed on 09/12/2012 - bulging disc with lumbar sprain and sciatica to the right were diagnosed. Patient recently moved back to [place] this past Friday. Stated that he tried calling PCP , but was unable to get an appointment - came to the ED for control of the pain. Denied headaches, dizziness, neck stiffness, urinary incontinence, bowel incontinence.  Patient had all paperwork with him.  History reviewed. No pertinent past medical history.  No past surgical history on file.  No family history on file.  History  Substance Use Topics  . Smoking status: Never Smoker   . Smokeless tobacco: Not on file  . Alcohol Use: No      Review of Systems  Constitutional: Negative for fever, chills and fatigue.  HENT: Negative for neck pain and neck stiffness.   Eyes: Negative for photophobia, pain and visual disturbance.  Respiratory: Negative for chest tightness and shortness of breath.   Cardiovascular: Negative for chest pain.  Genitourinary: Negative for dysuria, urgency, frequency, decreased urine volume and difficulty urinating.  Musculoskeletal:  Positive for back pain (lower back pain x 2 months).  Neurological: Positive for numbness (right leg x 2 month). Negative for dizziness, weakness, light-headedness and headaches.  All other systems reviewed and are negative.    Allergies  Review of patient's allergies indicates no known allergies.  Home Medications   Current Outpatient Rx  Name  Route  Sig  Dispense  Refill  . cyclobenzaprine (FLEXERIL) 10 MG tablet   Oral   Take 1 tablet (10 mg total) by mouth 2 (two) times daily as needed for muscle spasms.   20 tablet   0   . HYDROcodone-acetaminophen (NORCO) 5-325 MG per tablet   Oral   Take 1 tablet by mouth every 8 (eight) hours as needed for pain.   6 tablet   0   . predniSONE (DELTASONE) 50 MG tablet      Please take 1 tablet PO once daily x 5 days.   5 tablet   0     BP 120/92  Pulse 87  Temp(Src) 98.3 F (36.8 C) (Oral)  Resp 16  SpO2 100%  Physical Exam  Nursing note and vitals reviewed. Constitutional: He is oriented to person, place, and time. He appears well-developed and well-nourished. No distress.  HENT:  Head: Normocephalic and atraumatic.  Neck: Normal range of motion. Neck supple. No tracheal deviation present.  Negative neck stiffness Negative nuchal rigidity  Cardiovascular: Normal rate, regular rhythm and normal heart sounds.  Exam reveals no friction rub.   No murmur heard. Pulses:  Radial pulses are 2+ on the right side, and 2+ on the left side.       Dorsalis pedis pulses are 2+ on the right side, and 2+ on the left side.  Pulmonary/Chest: Effort normal and breath sounds normal. No respiratory distress. He has no wheezes. He has no rales.  Musculoskeletal: Normal range of motion. He exhibits no edema.       Cervical back: Normal. He exhibits normal range of motion, no tenderness, no bony tenderness, no swelling, no edema, no deformity, no laceration and no pain.       Lumbar back: He exhibits tenderness. He exhibits no swelling, no  edema, no deformity and no laceration.       Back:  Decreased ROM to the right lower extremity secondary to pain Strength 4+/5+ to the right lower extremity secondary to pain  Full ROM to the left lower extremity with strength 5+/5+  Lymphadenopathy:    He has no cervical adenopathy.  Neurological: He is alert and oriented to person, place, and time. No cranial nerve deficit. He exhibits normal muscle tone. Coordination normal.  Sensation to lower extremities, bilaterally, with differentiation to sharp and dull touch  Skin: Skin is warm and dry. No rash noted. He is not diaphoretic. No erythema.  Psychiatric: He has a normal mood and affect. His behavior is normal. Thought content normal.    ED Course  Procedures (including critical care time)  Labs Reviewed - No data to display No results found.   1. DJD (degenerative joint disease)   2. Sciatica, right   3. Lumbar sprain, sequela       MDM  Patient stable, afebrile. Cauda equina syndrome less likely. No neurovascular damage noted. Strength intact with motion noted. Back pain ongoing since March 2014 after injury, MRI performed on 09/12/2012 - bulging discs noted, sciatica to the right, DJD in lumbar spine. Patient discharged with prednisone, muscle relaxer, and pain medications - discussed course, precautions, restrictions, disposals. Referred patient to follow-up with PCP and neurosurgeon for follow-up. Discussed with patient that he needs to continue following up with [employer]. Discussed with patient to monitor symptoms and if symptoms are to worsen or change to report back to the ED. Patient agreed to plan of care, understood, all questions answered.         Raymon Mutton, PA-C 11/15/12 [phone]

## 2012-11-14 NOTE — ED Notes (Signed)
Bed:[facility]ALA<BR> Expected date:<BR> Expected time:<BR> Means of arrival:<BR> Comments:<BR> ems- SI, overdose on cogentin

## 2012-11-14 NOTE — ED Notes (Signed)
Pt c/o back pain x 2 months from a work injury.  States that he has been seen for this before but it hasn't gotten any better.  No new injury.

## 2012-11-15 NOTE — ED Provider Notes (Signed)
Medical screening examination/treatment/procedure(s) were performed by non-physician practitioner and as supervising physician I was immediately available for consultation/collaboration.   Dreamer Carillo, MD 11/15/12 1837 

## 2016-01-02 ENCOUNTER — Encounter: Payer: Self-pay | Admitting: Adult Health

## 2016-01-02 ENCOUNTER — Ambulatory Visit (INDEPENDENT_AMBULATORY_CARE_PROVIDER_SITE_OTHER): Payer: BLUE CROSS/BLUE SHIELD | Admitting: Adult Health

## 2016-01-02 VITALS — BP 110/74 | HR 82 | Temp 98.1°F | Ht 71.0 in | Wt 154.8 lb

## 2016-01-02 DIAGNOSIS — M5412 Radiculopathy, cervical region: Secondary | ICD-10-CM | POA: Diagnosis not present

## 2016-01-02 DIAGNOSIS — Z0000 Encounter for general adult medical examination without abnormal findings: Secondary | ICD-10-CM

## 2016-01-02 MED ORDER — METHYLPREDNISOLONE 4 MG PO TBPK: ORAL_TABLET | ORAL | Status: DC

## 2016-01-02 NOTE — Progress Notes (Signed)
Patient presents to clinic today to establish care. He is a pleasant 40 year old male who  has no past medical history on file. [place] is [place]   His last complete physical was in 2015    Acute Concerns: Complete Physical   Left shoulder pain  - he reports that over the last two weeks he has had a numbness and tingling down the left arm. The discomfort starts in the left shoulder and radiates down to his wrist. The pain is not present all the time. He does not have any loss of ROM. He has not tried anything over the counter.   Chronic Issues: None   Health Maintenance: Dental -- Does not see Vision -- Yearly  Immunizations --UTD  Diet: Eats healthy Exercise: Runs and plays soccer multiple times per week.    No past medical history on file.  No past surgical history on file.  Current Outpatient Prescriptions on File Prior to Visit  Medication Sig Dispense Refill  . cyclobenzaprine (FLEXERIL) 10 MG tablet Take 1 tablet (10 mg total) by mouth 2 (two) times daily as needed for muscle spasms. (Patient not taking: Reported on 01/02/2016) 20 tablet 0  . HYDROcodone-acetaminophen (NORCO) 5-325 MG per tablet Take 1 tablet by mouth every 8 (eight) hours as needed for pain. (Patient not taking: Reported on 01/02/2016) 6 tablet 0  . predniSONE (DELTASONE) 50 MG tablet Please take 1 tablet PO once daily x 5 days. (Patient not taking: Reported on 01/02/2016) 5 tablet 0   No current facility-administered medications on file prior to visit.    No Known Allergies  No family history on file.  Social History   Social History  . Marital Status: Single    Spouse Name: N/A  . Number of Children: N/A  . Years of Education: N/A   Occupational History  . Not on file.   Social History Main Topics  . Smoking status: Never Smoker   . Smokeless tobacco: Not on file  . Alcohol Use: No  . Drug Use: No  . Sexual Activity: Not on file   Other Topics Concern  . Not on file    Social History Narrative    Review of Systems  Constitutional: Negative.   HENT: Negative.   Eyes: Negative.   Respiratory: Negative.   Cardiovascular: Negative.   Gastrointestinal: Negative.   Genitourinary: Negative.   Musculoskeletal: Positive for joint pain.  Skin: Negative.   Neurological: Positive for tingling. Negative for sensory change and focal weakness.  Endo/Heme/Allergies: Negative.   Psychiatric/Behavioral: Negative.     Pulse 82  Temp(Src) 98.1 F (36.7 C) (Oral)  Ht 5\' 11"  (1.803 m)  Wt 154 lb 12.8 oz (70.217 kg)  BMI 21.60 kg/m2  SpO2 97%  Physical Exam  Constitutional: He is oriented to person, place, and time and well-developed, well-nourished, and in no distress. No distress.  HENT:  Head: Normocephalic and atraumatic.  Right Ear: External ear normal.  Left Ear: External ear normal.  Nose: Nose normal.  Mouth/Throat: Oropharynx is clear and moist. No oropharyngeal exudate.  Eyes: Conjunctivae and EOM are normal. Pupils are equal, round, and reactive to light. Right eye exhibits no discharge. Left eye exhibits no discharge. No scleral icterus.  Neck: Normal range of motion. Neck supple. No JVD present. No tracheal deviation present. No thyromegaly present.  Cardiovascular: Normal rate, regular rhythm and intact distal pulses.  Exam reveals no gallop and no friction rub.   No murmur  heard. Pulmonary/Chest: Effort normal and breath sounds normal. No stridor. No respiratory distress. He has no wheezes. He has no rales. He exhibits no tenderness.  Abdominal: Soft. Bowel sounds are normal. He exhibits no distension and no mass. There is no tenderness. There is no rebound and no guarding.  Genitourinary:  Deferred  Musculoskeletal: Normal range of motion. He exhibits tenderness (to left scapula and acromion radiating down to bicep). He exhibits no edema.  Lymphadenopathy:    He has no cervical adenopathy.  Neurological: He is alert and oriented to person,  place, and time. He has normal reflexes. He displays normal reflexes. No cranial nerve deficit. He exhibits normal muscle tone. Gait normal. Coordination normal. GCS score is 15.  Skin: Skin is warm and dry. No rash noted. He is not diaphoretic. No erythema. No pallor.  Psychiatric: Mood, memory, affect and judgment normal.  Nursing note and vitals reviewed.  Assessment/Plan:  1. Routine general medical examination at a health care facility - Follow up for labs - POCT Urinalysis Dipstick (Automated); Future - Basic metabolic panel; Future - CBC with Differential/Platelet; Future - Hepatic function panel; Future - Lipid panel; Future - TSH; Future - Follow up in one year for CPE - Follow up sooner if needed - Continue to exercise and eat healthy  2. Cervical radiculitis - methylPREDNISolone (MEDROL DOSEPAK) 4 MG TBPK tablet; Take as directed  Dispense: 21 tablet; Refill: 0 - Take flexeril at night as he has it already prescribed - Motrin  every 8 hours for 3 days -Heating pad - Follow up if no improvement  Shirline Frees, NP

## 2016-01-02 NOTE — Patient Instructions (Addendum)
It was great meeting you today   Please come back one morning for your labs. Do not eat anything after midnight and nothing to drink besides water. When you figure out which morning you can come, please call the office to get on the lab schedule  I have sent in a prescription for a Medrol Dose pack - this is for your shoulder.   You can also take a Flexeril at night  Take Motrin 600mg  every 8 hours for the next 3 days.   Follow up if no improvement  Health Maintenance, Male A healthy lifestyle and preventative care can promote health and wellness.  Maintain regular health, dental, and eye exams.  Eat a healthy diet. Foods like vegetables, fruits, whole grains, low-fat dairy products, and lean protein foods contain the nutrients you need and are low in calories. Decrease your intake of foods high in solid fats, added sugars, and salt. Get information about a proper diet from your health care provider, if necessary.  Regular physical exercise is one of the most important things you can do for your health. Most adults should get at least 150 minutes of moderate-intensity exercise (any activity that increases your heart rate and causes you to sweat) each week. In addition, most adults need muscle-strengthening exercises on 2 or more days a week.   Maintain a healthy weight. The body mass index (BMI) is a screening tool to identify possible weight problems. It provides an estimate of body fat based on height and weight. Your health care provider can find your BMI and can help you achieve or maintain a healthy weight. For males 20 years and older:  A BMI below 18.5 is considered underweight.  A BMI of 18.5 to 24.9 is normal.  A BMI of 25 to 29.9 is considered overweight.  A BMI of 30 and above is considered obese.  Maintain normal blood lipids and cholesterol by exercising and minimizing your intake of saturated fat. Eat a balanced diet with plenty of fruits and vegetables. Blood tests for  lipids and cholesterol should begin at age 11 and be repeated every 5 years. If your lipid or cholesterol levels are high, you are over age 64, or you are at high risk for heart disease, you may need your cholesterol levels checked more frequently.Ongoing high lipid and cholesterol levels should be treated with medicines if diet and exercise are not working.  If you smoke, find out from your health care provider how to quit. If you do not use tobacco, do not start.  Lung cancer screening is recommended for adults aged 55-80 years who are at high risk for developing lung cancer because of a history of smoking. A yearly low-dose CT scan of the lungs is recommended for people who have at least a 30-pack-year history of smoking and are current smokers or have quit within the past 15 years. A pack year of smoking is smoking an average of 1 pack of cigarettes a day for 1 year (for example, a 30-pack-year history of smoking could mean smoking 1 pack a day for 30 years or 2 packs a day for 15 years). Yearly screening should continue until the smoker has stopped smoking for at least 15 years. Yearly screening should be stopped for people who develop a health problem that would prevent them from having lung cancer treatment.  If you choose to drink alcohol, do not have more than 2 drinks per day. One drink is considered to be 12 oz (360 mL)  of beer, 5 oz (150 mL) of wine, or 1.5 oz (45 mL) of liquor.  Avoid the use of street drugs. Do not share needles with anyone. Ask for help if you need support or instructions about stopping the use of drugs.  High blood pressure causes heart disease and increases the risk of stroke. High blood pressure is more likely to develop in:  People who have blood pressure in the end of the normal range (100-139/85-89 mm Hg).  People who are overweight or obese.  People who are African American.  If you are 4918-38 years of age, have your blood pressure checked every 3-5 years. If  you are 38 years of age or older, have your blood pressure checked every year. You should have your blood pressure measured twice--once when you are at a hospital or clinic, and once when you are not at a hospital or clinic. Record the average of the two measurements. To check your blood pressure when you are not at a hospital or clinic, you can use:  An automated blood pressure machine at a pharmacy.  A home blood pressure monitor.  If you are 7545-38 years old, ask your health care provider if you should take aspirin to prevent heart disease.  Diabetes screening involves taking a blood sample to check your fasting blood sugar level. This should be done once every 3 years after age 10245 if you are at a normal weight and without risk factors for diabetes. Testing should be considered at a younger age or be carried out more frequently if you are overweight and have at least 1 risk factor for diabetes.  Colorectal cancer can be detected and often prevented. Most routine colorectal cancer screening begins at the age of 38 and continues through age 38. However, your health care provider may recommend screening at an earlier age if you have risk factors for colon cancer. On a yearly basis, your health care provider may provide home test kits to check for hidden blood in the stool. A small camera at the end of a tube may be used to directly examine the colon (sigmoidoscopy or colonoscopy) to detect the earliest forms of colorectal cancer. Talk to your health care provider about this at age 38 when routine screening begins. A direct exam of the colon should be repeated every 5-10 years through age 38, unless early forms of precancerous polyps or small growths are found.  People who are at an increased risk for hepatitis B should be screened for this virus. You are considered at high risk for hepatitis B if:  You were born in a country where hepatitis B occurs often. Talk with your health care provider about which  countries are considered high risk.  Your parents were born in a high-risk country and you have not received a shot to protect against hepatitis B (hepatitis B vaccine).  You have HIV or AIDS.  You use needles to inject street drugs.  You live with, or have sex with, someone who has hepatitis B.  You are a man who has sex with other men (MSM).  You get hemodialysis treatment.  You take certain medicines for conditions like cancer, organ transplantation, and autoimmune conditions.  Hepatitis C blood testing is recommended for all people born from [birth year] through 1963 and any individual with known risk factors for hepatitis C.  Healthy men should no longer receive prostate-specific antigen (PSA) blood tests as part of routine cancer screening. Talk to your health care provider about  prostate cancer screening.  Testicular cancer screening is not recommended for adolescents or adult males who have no symptoms. Screening includes self-exam, a health care provider exam, and other screening tests. Consult with your health care provider about any symptoms you have or any concerns you have about testicular cancer.  Practice safe sex. Use condoms and avoid high-risk sexual practices to reduce the spread of sexually transmitted infections (STIs).  You should be screened for STIs, including gonorrhea and chlamydia if:  You are sexually active and are younger than 24 years.  You are older than 24 years, and your health care provider tells you that you are at risk for this type of infection.  Your sexual activity has changed since you were last screened, and you are at an increased risk for chlamydia or gonorrhea. Ask your health care provider if you are at risk.  If you are at risk of being infected with HIV, it is recommended that you take a prescription medicine daily to prevent HIV infection. This is called pre-exposure prophylaxis (PrEP). You are considered at risk if:  You are a man who has  sex with other men (MSM).  You are a heterosexual man who is sexually active with multiple partners.  You take drugs by injection.  You are sexually active with a partner who has HIV.  Talk with your health care provider about whether you are at high risk of being infected with HIV. If you choose to begin PrEP, you should first be tested for HIV. You should then be tested every 3 months for as long as you are taking PrEP.  Use sunscreen. Apply sunscreen liberally and repeatedly throughout the day. You should seek shade when your shadow is shorter than you. Protect yourself by wearing long sleeves, pants, a wide-brimmed hat, and sunglasses year round whenever you are outdoors.  Tell your health care provider of new moles or changes in moles, especially if there is a change in shape or color. Also, tell your health care provider if a mole is larger than the size of a pencil eraser.  A one-time screening for abdominal aortic aneurysm (AAA) and surgical repair of large AAAs by ultrasound is recommended for men aged 65-75 years who are current or former smokers.  Stay current with your vaccines (immunizations).   This information is not intended to replace advice given to you by your health care provider. Make sure you discuss any questions you have with your health care provider.   Document Released: 11/27/2007 Document Revised: 06/21/2014 Document Reviewed: 10/26/2010 Elsevier Interactive Patient Education [employer]

## 2016-01-02 NOTE — Progress Notes (Signed)
Pre visit review using our clinic review tool, if applicable. No additional management support is needed unless otherwise documented below in the visit note. 

## 2016-01-09 ENCOUNTER — Telehealth: Payer: Self-pay | Admitting: Adult Health

## 2016-01-09 ENCOUNTER — Other Ambulatory Visit (INDEPENDENT_AMBULATORY_CARE_PROVIDER_SITE_OTHER): Payer: BLUE CROSS/BLUE SHIELD

## 2016-01-09 DIAGNOSIS — Z0000 Encounter for general adult medical examination without abnormal findings: Secondary | ICD-10-CM | POA: Diagnosis not present

## 2016-01-09 DIAGNOSIS — M25512 Pain in left shoulder: Secondary | ICD-10-CM

## 2016-01-09 LAB — CBC WITH DIFFERENTIAL/PLATELET
BASOS PCT: 0.6 % (ref 0.0–3.0)
Basophils Absolute: 0 10*3/uL (ref 0.0–0.1)
EOS PCT: 7.4 % — AB (ref 0.0–5.0)
Eosinophils Absolute: 0.4 10*3/uL (ref 0.0–0.7)
HEMATOCRIT: 43.9 % (ref 39.0–52.0)
HEMOGLOBIN: 14.9 g/dL (ref 13.0–17.0)
LYMPHS PCT: 49.8 % — AB (ref 12.0–46.0)
Lymphs Abs: 2.4 10*3/uL (ref 0.7–4.0)
MCHC: 34 g/dL (ref 30.0–36.0)
MCV: 88.1 fl (ref 78.0–100.0)
MONOS PCT: 8.6 % (ref 3.0–12.0)
Monocytes Absolute: 0.4 10*3/uL (ref 0.1–1.0)
Neutro Abs: 1.6 10*3/uL (ref 1.4–7.7)
Neutrophils Relative %: 33.6 % — ABNORMAL LOW (ref 43.0–77.0)
Platelets: 234 10*3/uL (ref 150.0–400.0)
RBC: 4.99 Mil/uL (ref 4.22–5.81)
RDW: 13.5 % (ref 11.5–15.5)
WBC: 4.8 10*3/uL (ref 4.0–10.5)

## 2016-01-09 LAB — POC URINALSYSI DIPSTICK (AUTOMATED)
BILIRUBIN UA: NEGATIVE
Blood, UA: NEGATIVE
Glucose, UA: NEGATIVE
KETONES UA: NEGATIVE
Leukocytes, UA: NEGATIVE
Nitrite, UA: NEGATIVE
PROTEIN UA: NEGATIVE
SPEC GRAV UA: 1.02
Urobilinogen, UA: 0.2
pH, UA: 7

## 2016-01-09 LAB — LIPID PANEL
CHOL/HDL RATIO: 2
Cholesterol: 151 mg/dL (ref 0–200)
HDL: 61.4 mg/dL (ref >39.00)
LDL CALC: 79 mg/dL (ref 0–99)
NonHDL: 89.67
TRIGLYCERIDES: 55 mg/dL (ref 0.0–149.0)
VLDL: 11 mg/dL (ref 0.0–40.0)

## 2016-01-09 LAB — BASIC METABOLIC PANEL
BUN: 11 mg/dL (ref 6–23)
CHLORIDE: 103 meq/L (ref 96–112)
CO2: 31 mEq/L (ref 19–32)
Calcium: 9.3 mg/dL (ref 8.4–10.5)
Creatinine, Ser: 1.04 mg/dL (ref 0.40–1.50)
GFR: 84.69 mL/min (ref >60.00)
Glucose, Bld: 91 mg/dL (ref 70–99)
POTASSIUM: 3.6 meq/L (ref 3.5–5.1)
SODIUM: 138 meq/L (ref 135–145)

## 2016-01-09 LAB — HEPATIC FUNCTION PANEL
ALK PHOS: 63 U/L (ref 39–117)
ALT: 12 U/L (ref 0–53)
AST: 13 U/L (ref 0–37)
Albumin: 3.8 g/dL (ref 3.5–5.2)
BILIRUBIN DIRECT: 0.1 mg/dL (ref 0.0–0.3)
BILIRUBIN TOTAL: 0.7 mg/dL (ref 0.2–1.2)
Total Protein: 7.5 g/dL (ref 6.0–8.3)

## 2016-01-09 LAB — TSH: TSH: 1.17 u[IU]/mL (ref 0.35–4.50)

## 2016-01-09 MED ORDER — MELOXICAM 15 MG PO TABS: 15.0000 mg | ORAL_TABLET | Freq: Every day | ORAL | 0 refills | Status: DC

## 2016-01-09 NOTE — Telephone Encounter (Signed)
Patient states he was prescribed Medication for pain in his shoulder and it isn't helping him.  He wants to discuss another option.

## 2016-01-09 NOTE — Telephone Encounter (Signed)
Per last office note, patient was prescribed prednisone - also was advised to continue taking flexeril, and motrin 600mg  every 8 hours PRN for this issue. Please advise.

## 2016-01-09 NOTE — Telephone Encounter (Signed)
Patient notified. Patient would like to speak with [facility].

## 2016-01-09 NOTE — Telephone Encounter (Signed)
I can send him for a x ray but any other treatment needs a follow up appointment

## 2016-01-09 NOTE — Telephone Encounter (Signed)
Spoke to Roy Miller and he informed me that he is still having pain in his left shoulder. He was prescribed Flexeril and a Medrol Dose pack. He was also asked to take 600mg  Motrin every 8 hours for pain - during his last office visit.   I will send in a prescription for Mobic 15mg  as well as order an x ray

## 2016-01-16 ENCOUNTER — Ambulatory Visit (INDEPENDENT_AMBULATORY_CARE_PROVIDER_SITE_OTHER)
Admission: RE | Admit: 2016-01-16 | Discharge: 2016-01-16 | Disposition: A | Discharge status: Home / Self Care | Payer: BLUE CROSS/BLUE SHIELD | Source: Ambulatory Visit | Attending: Adult Health | Admitting: Adult Health

## 2016-01-16 ENCOUNTER — Telehealth: Payer: Self-pay | Admitting: Adult Health

## 2016-01-16 DIAGNOSIS — M25512 Pain in left shoulder: Secondary | ICD-10-CM | POA: Diagnosis not present

## 2016-01-16 NOTE — Telephone Encounter (Signed)
See below

## 2016-01-16 NOTE — Telephone Encounter (Signed)
Pt would like shoulder xray results. Pt had xray today

## 2016-01-19 NOTE — Telephone Encounter (Signed)
Pt would like a call back concerning xray.  Pt is still having left shoulder pain

## 2016-01-20 ENCOUNTER — Telehealth: Payer: Self-pay | Admitting: Adult Health

## 2016-01-20 DIAGNOSIS — M25512 Pain in left shoulder: Secondary | ICD-10-CM

## 2016-01-20 NOTE — Telephone Encounter (Signed)
Left VM to call back regarding x ray

## 2016-01-20 NOTE — Telephone Encounter (Signed)
Pt returning your call

## 2016-01-20 NOTE — Telephone Encounter (Signed)
Spoke to Roy Miller and informed him of his shoulder x ray. The shoulder was negative. He reports that he continues to have pain in his shoulder. I will refer him to sports medicine for further evaluation.

## 2016-01-31 NOTE — Progress Notes (Signed)
Tawana ScaleZach Vara Mairena D.O. [facility] [address] [place], [place] [address] Phone: [phone] Subjective:    I'm seeing this patient by the request  of:  Shirline Freesory Nafziger, NP   CC: left shoulder pain   BJY:[id number]:Subjective  Ilias Shawna ClampM Kocsis is a 40 y.o. male coming in with complaint of left shoulder pain. Been hurting for 6 weeks. With radiation down arm, intermittent pain and radiation, Hurts at night sometimes, saw PCP who was concern for cervical radiculitis. Was given prednisone, flexeril and ibuprofen with minimal improvement. Patient states she continues to have discomfort. States the repetitive motion seems to make it worse. Wakes him up at night. Rates the severity pain is 7 out of 10. Tightness of the neck but states that it seems to start in his left shoulder.   Shoulder xray reviewed by me show no bony abnormality.   No past medical history on file. Past Surgical History:  Procedure Laterality Date  . BACK SURGERY  2006    Social History   Social History  . Marital status: Single    Spouse name: N/A  . Number of children: N/A  . Years of education: N/A   Social History Main Topics  . Smoking status: Never Smoker  . Smokeless tobacco: None  . Alcohol use No  . Drug use: No  . Sexual activity: Yes    Birth control/ protection: Condom   Other Topics Concern  . None   Social History Narrative   Works as a Estate agentforklift operator    Not married   Four children ( [place]       He likes to play soccer with his kids and go to church    No Known Allergies Family History  Problem Relation Age of Onset  . Glaucoma Mother     Past medical history, social, surgical and family history all reviewed in electronic medical record.  No pertanent information unless stated regarding to the chief complaint.   Review of Systems: No headache, visual changes, nausea, vomiting, diarrhea, constipation, dizziness, abdominal pain, skin rash, fevers, chills, night sweats, weight loss,  swollen lymph nodes, body aches, joint swelling, muscle aches, chest pain, shortness of breath, mood changes.   Objective  Blood pressure 118/80, pulse 82, height 5\' 11"  (1.803 m), weight 156 lb (70.8 kg), SpO2 94 %.  General: No apparent distress alert and oriented x3 mood and affect normal, dressed appropriately.  HEENT: Pupils equal, extraocular movements intact  Respiratory: Patient's speak in full sentences and does not appear short of breath  Cardiovascular: No lower extremity edema, non tender, no erythema  Skin: Warm dry intact with no signs of infection or rash on extremities or on axial skeleton.  Abdomen: Soft nontender  Neuro: Cranial nerves II through XII are intact, neurovascularly intact in all extremities with 2+ DTRs and 2+ pulses.  Lymph: No lymphadenopathy of posterior or anterior cervical chain or axillae bilaterally.  Gait normal with good balance and coordination.  MSK:  Non tender with full range of motion and good stability and symmetric strength and tone of , elbows, wrist, hip, knee and ankles bilaterally.  Neck: Inspection unremarkable. No palpable stepoffs. Negative Spurling's maneuver. Lacks last 5 of extension and right-sided side bending Grip strength and sensation normal in bilateral hands Strength good C4 to T1 distribution No sensory change to C4 to T1 Negative Hoffman sign bilaterally Reflexes normal Shoulder: left Inspection reveals no abnormalities, atrophy or asymmetry. Palpation is normal with no tenderness over [facility]  joint or bicipital groove. ROM is full in all planes passively. Rotator cuff strength normal throughout. signs of impingement with positive Neer and Hawkin's tests, but negative empty can sign. Speeds and Yergason's tests normal. No labral pathology noted with negative Obrien's, negative clunk and good stability. Normal scapular function observed. No painful arc and no drop arm sign. No apprehension sign  MSK US performed of:  left This study was ordered, performed, and interpreted by Terrilee FilesZach Laylani Pudwill D.O.  Shoulder:   Supraspinatus:  Appears normal on long and transverse views, Bursal bulge seen with shoulder abduction on impingement view. Infraspinatus:  Appears normal on long and transverse views. Significant increase in Doppler flow Subscapularis:  Appears normal on long and transverse views. Positive bursa Teres Minor:  Appears normal on long and transverse views. AC joint:  Capsule undistended, no geyser sign. Glenohumeral Joint:  Appears normal without effusion. Glenoid Labrum:  Intact without visualized tears. Biceps Tendon:  Appears normal on long and transverse views, no fraying of tendon, tendon located in intertubercular groove, no subluxation with shoulder internal or external rotation.  Impression: Subacromial bursitis  Procedure: Real-time Ultrasound Guided Injection of left glenohumeral joint Device: GE Logiq E  Ultrasound guided injection is preferred based studies that show increased duration, increased effect, greater accuracy, decreased procedural pain, increased response rate with ultrasound guided versus blind injection.  Verbal informed consent obtained.  Time-out conducted.  Noted no overlying erythema, induration, or other signs of local infection.  Skin prepped in a sterile fashion.  Local anesthesia: Topical Ethyl chloride.  With sterile technique and under real time ultrasound guidance:  Joint visualized.  23g 1  inch needle inserted posterior approach. Pictures taken for needle placement. Patient did have injection of 2 cc of 1% lidocaine, 2 cc of 0.5% Marcaine, and 1.0 cc of Kenalog 40 mg/dL. Completed without difficulty  Pain immediately resolved suggesting accurate placement of the medication.  Advised to call if fevers/chills, erythema, induration, drainage, or persistent bleeding.  Images permanently stored and available for review in the ultrasound unit.  Impression: Technically  successful ultrasound guided injection.     Impression and Recommendations:     This case required medical decision making of moderate complexity.      Note: This dictation was prepared with Dragon dictation along with smaller phrase technology. Any transcriptional errors that result from this process are unintentional.

## 2016-02-02 ENCOUNTER — Other Ambulatory Visit: Payer: Self-pay

## 2016-02-02 ENCOUNTER — Ambulatory Visit (INDEPENDENT_AMBULATORY_CARE_PROVIDER_SITE_OTHER)
Admission: RE | Admit: 2016-02-02 | Discharge: 2016-02-02 | Disposition: A | Discharge status: Home / Self Care | Payer: BLUE CROSS/BLUE SHIELD | Source: Ambulatory Visit | Attending: Family Medicine | Admitting: Family Medicine

## 2016-02-02 ENCOUNTER — Encounter: Payer: Self-pay | Admitting: *Deleted

## 2016-02-02 ENCOUNTER — Ambulatory Visit (INDEPENDENT_AMBULATORY_CARE_PROVIDER_SITE_OTHER): Payer: BLUE CROSS/BLUE SHIELD | Admitting: Family Medicine

## 2016-02-02 ENCOUNTER — Encounter: Payer: Self-pay | Admitting: Family Medicine

## 2016-02-02 VITALS — BP 118/80 | HR 82 | Ht 71.0 in | Wt 156.0 lb

## 2016-02-02 DIAGNOSIS — M25512 Pain in left shoulder: Secondary | ICD-10-CM

## 2016-02-02 DIAGNOSIS — M7552 Bursitis of left shoulder: Secondary | ICD-10-CM

## 2016-02-02 DIAGNOSIS — M7550 Bursitis of unspecified shoulder: Secondary | ICD-10-CM | POA: Insufficient documentation

## 2016-02-02 MED ORDER — GABAPENTIN 100 MG PO CAPS: 200.0000 mg | ORAL_CAPSULE | Freq: Every day | ORAL | 3 refills | Status: DC

## 2016-02-02 NOTE — Assessment & Plan Note (Signed)
Patient given injection today and tolerated the procedure well. We discussed icing regimen and home exercises. We discussed which activities doing which ones to potentially avoid. Patient and will come back and see me again in 3-4 weeks. X-rays are ordered for the next 2 rule out any cervical radiculopathy that could be concerning but I do think that this is low likelihood. Medications per orders.

## 2016-02-02 NOTE — Patient Instructions (Addendum)
Good to see you.  Xray of neck downstairs today  Gabapentin 200mg  at night to help with the pain  pennsaid pinkie amount topically 2 times daily as needed.  Ice 20 minutes 2 times daily. Usually after activity and before bed. Exercises 3 times a week.  Avoid activity where hands are outside or peripheral vision .  See me again in 4 weeks to make sure you are doing well.

## 2016-02-05 ENCOUNTER — Other Ambulatory Visit: Payer: Self-pay | Admitting: Adult Health

## 2016-02-05 NOTE — Telephone Encounter (Signed)
Ok to refill 

## 2016-02-05 NOTE — Telephone Encounter (Signed)
Ok to refill for one month  

## 2016-03-26 ENCOUNTER — Other Ambulatory Visit: Payer: Self-pay | Admitting: *Deleted

## 2016-03-26 MED ORDER — GABAPENTIN 100 MG PO CAPS: 200.0000 mg | ORAL_CAPSULE | Freq: Every day | ORAL | 1 refills | Status: DC

## 2016-03-26 NOTE — Telephone Encounter (Signed)
Refill done.  

## 2016-04-07 ENCOUNTER — Emergency Department (HOSPITAL_COMMUNITY)
Admission: EM | Admit: 2016-04-07 | Discharge: 2016-04-07 | Disposition: A | Discharge status: Home / Self Care | Payer: BLUE CROSS/BLUE SHIELD | Attending: Emergency Medicine | Admitting: Emergency Medicine

## 2016-04-07 ENCOUNTER — Encounter (HOSPITAL_COMMUNITY): Payer: Self-pay

## 2016-04-07 DIAGNOSIS — M545 Low back pain: Secondary | ICD-10-CM | POA: Diagnosis not present

## 2016-04-07 DIAGNOSIS — R0789 Other chest pain: Secondary | ICD-10-CM | POA: Insufficient documentation

## 2016-04-07 DIAGNOSIS — Z79899 Other long term (current) drug therapy: Secondary | ICD-10-CM | POA: Insufficient documentation

## 2016-04-07 DIAGNOSIS — M542 Cervicalgia: Secondary | ICD-10-CM | POA: Diagnosis present

## 2016-04-07 DIAGNOSIS — Y999 Unspecified external cause status: Secondary | ICD-10-CM | POA: Diagnosis not present

## 2016-04-07 DIAGNOSIS — Y92410 Unspecified street and highway as the place of occurrence of the external cause: Secondary | ICD-10-CM | POA: Insufficient documentation

## 2016-04-07 DIAGNOSIS — Y9389 Activity, other specified: Secondary | ICD-10-CM | POA: Insufficient documentation

## 2016-04-07 MED ORDER — NAPROXEN 500 MG PO TABS: 500.0000 mg | ORAL_TABLET | Freq: Two times a day (BID) | ORAL | 0 refills | Status: DC

## 2016-04-07 MED ORDER — KETOROLAC TROMETHAMINE 60 MG/2ML IM SOLN
60.0000 mg | Freq: Once | INTRAMUSCULAR | Status: AC
  Administered 2016-04-07: 60 mg via INTRAMUSCULAR
  Filled 2016-04-07: qty 2

## 2016-04-07 MED ORDER — METHOCARBAMOL 500 MG PO TABS
1000.0000 mg | ORAL_TABLET | Freq: Once | ORAL | Status: AC
  Administered 2016-04-07: 1000 mg via ORAL
  Filled 2016-04-07: qty 2

## 2016-04-07 MED ORDER — METHOCARBAMOL 500 MG PO TABS: 500.0000 mg | ORAL_TABLET | Freq: Two times a day (BID) | ORAL | 0 refills | Status: DC

## 2016-04-07 MED ORDER — LIDOCAINE 5 % EX PTCH: 1.0000 | MEDICATED_PATCH | CUTANEOUS | 0 refills | Status: DC

## 2016-04-07 NOTE — ED Notes (Signed)
C-collar remains intact.
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
## 2016-04-07 NOTE — ED Notes (Signed)
Pt requesting work note.  PA-C informed.

## 2016-04-07 NOTE — Discharge Instructions (Signed)
Expect your soreness to increase over the next 2-3 days. Take it easy, but do not lay around too much as this may make the stiffness worse. Take 500 mg of naproxen every 12 hours or 800 mg of ibuprofen every 8 hours for the next 3 days. Take these medications with food to avoid upset stomach. Robaxin is a muscle relaxer and may help loosen stiff muscles. Do not take the Robaxin while driving or performing other dangerous activities. °Follow up with a PCP for chronic management of this issue. °

## 2016-04-07 NOTE — ED Notes (Signed)
Pt requesting meds to be given in the ER, does not want to go to pharmacy tonight.  Informed will have to speak to PA-C.  Pt verbalized understanding.

## 2016-05-13 ENCOUNTER — Encounter: Payer: Self-pay | Admitting: Adult Health

## 2016-05-13 ENCOUNTER — Ambulatory Visit (INDEPENDENT_AMBULATORY_CARE_PROVIDER_SITE_OTHER): Payer: BLUE CROSS/BLUE SHIELD | Admitting: Adult Health

## 2016-05-13 ENCOUNTER — Ambulatory Visit (INDEPENDENT_AMBULATORY_CARE_PROVIDER_SITE_OTHER)
Admission: RE | Admit: 2016-05-13 | Discharge: 2016-05-13 | Disposition: A | Discharge status: Home / Self Care | Payer: BLUE CROSS/BLUE SHIELD | Source: Ambulatory Visit | Attending: Adult Health | Admitting: Adult Health

## 2016-05-13 DIAGNOSIS — M545 Low back pain: Secondary | ICD-10-CM

## 2016-05-13 DIAGNOSIS — M542 Cervicalgia: Secondary | ICD-10-CM | POA: Diagnosis not present

## 2016-05-13 DIAGNOSIS — Z043 Encounter for examination and observation following other accident: Secondary | ICD-10-CM

## 2016-05-13 DIAGNOSIS — Z041 Encounter for examination and observation following transport accident: Secondary | ICD-10-CM

## 2016-05-13 MED ORDER — KETOROLAC TROMETHAMINE 60 MG/2ML IM SOLN
60.0000 mg | Freq: Once | INTRAMUSCULAR | Status: AC
  Administered 2016-05-13: 60 mg via INTRAMUSCULAR

## 2016-05-13 MED ORDER — TIZANIDINE HCL 4 MG PO TABS: 4.0000 mg | ORAL_TABLET | Freq: Four times a day (QID) | ORAL | 0 refills | Status: AC | PRN

## 2016-05-13 MED ORDER — METHYLPREDNISOLONE ACETATE 80 MG/ML IJ SUSP
80.0000 mg | Freq: Once | INTRAMUSCULAR | Status: AC
  Administered 2016-05-13: 80 mg via INTRAMUSCULAR
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
## 2016-05-13 NOTE — Patient Instructions (Signed)
It was great seeing you today. I am sorry you are in so much pain.   Please go to the [facility] and have x rays done.   I have sent in a prescription for Zanaflex, this is a different muscle relaxer. Take at bedtime. Continue to use Motrin 600 mg every 8 hours as needed
